# Patient Record
Sex: Male | Born: 2005 | Race: White | Hispanic: No | Marital: Single | Smoking: Never smoker
Health system: Southern US, Community
[De-identification: ages and names within clinical notes are randomized; demographics above are authoritative.]

## PROBLEM LIST (undated history)

## (undated) HISTORY — PX: TYMPANOSTOMY TUBE PLACEMENT: SHX32

---
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
## 2024-03-05 NOTE — Progress Notes (Signed)
 Employment screening

## 2024-04-13 ENCOUNTER — Emergency Department (HOSPITAL_BASED_OUTPATIENT_CLINIC_OR_DEPARTMENT_OTHER)

## 2024-04-13 ENCOUNTER — Emergency Department (HOSPITAL_BASED_OUTPATIENT_CLINIC_OR_DEPARTMENT_OTHER): Admitting: Radiology

## 2024-04-13 ENCOUNTER — Other Ambulatory Visit: Payer: Self-pay

## 2024-04-13 ENCOUNTER — Encounter (HOSPITAL_BASED_OUTPATIENT_CLINIC_OR_DEPARTMENT_OTHER): Payer: Self-pay | Admitting: Emergency Medicine

## 2024-04-13 ENCOUNTER — Emergency Department (HOSPITAL_BASED_OUTPATIENT_CLINIC_OR_DEPARTMENT_OTHER)
Admission: EM | Admit: 2024-04-13 | Discharge: 2024-04-13 | Disposition: A | Discharge status: Home / Self Care | Attending: Emergency Medicine | Admitting: Emergency Medicine

## 2024-04-13 DIAGNOSIS — R1084 Generalized abdominal pain: Secondary | ICD-10-CM | POA: Insufficient documentation

## 2024-04-13 DIAGNOSIS — J020 Streptococcal pharyngitis: Secondary | ICD-10-CM

## 2024-04-13 DIAGNOSIS — Y92410 Unspecified street and highway as the place of occurrence of the external cause: Secondary | ICD-10-CM | POA: Diagnosis not present

## 2024-04-13 DIAGNOSIS — M545 Low back pain, unspecified: Secondary | ICD-10-CM | POA: Diagnosis present

## 2024-04-13 DIAGNOSIS — S22080A Wedge compression fracture of T11-T12 vertebra, initial encounter for closed fracture: Secondary | ICD-10-CM

## 2024-04-13 DIAGNOSIS — S40811A Abrasion of right upper arm, initial encounter: Secondary | ICD-10-CM

## 2024-04-13 LAB — CBC WITH DIFFERENTIAL/PLATELET
Abs Immature Granulocytes: 0.11 K/uL — ABNORMAL HIGH (ref 0.00–0.07)
Basophils Absolute: 0 K/uL (ref 0.0–0.1)
Basophils Relative: 0 %
Eosinophils Absolute: 0.1 K/uL (ref 0.0–0.5)
Eosinophils Relative: 1 %
HCT: 47.4 % (ref 39.0–52.0)
Hemoglobin: 16.5 g/dL (ref 13.0–17.0)
Immature Granulocytes: 1 %
Lymphocytes Relative: 7 %
Lymphs Abs: 0.9 K/uL (ref 0.7–4.0)
MCH: 29.2 pg (ref 26.0–34.0)
MCHC: 34.8 g/dL (ref 30.0–36.0)
MCV: 83.9 fL (ref 80.0–100.0)
Monocytes Absolute: 1 K/uL (ref 0.1–1.0)
Monocytes Relative: 9 %
Neutro Abs: 9.5 K/uL — ABNORMAL HIGH (ref 1.7–7.7)
Neutrophils Relative %: 82 %
Platelets: 171 K/uL (ref 150–400)
RBC: 5.65 MIL/uL (ref 4.22–5.81)
RDW: 12.9 % (ref 11.5–15.5)
WBC: 11.6 K/uL — ABNORMAL HIGH (ref 4.0–10.5)
nRBC: 0 % (ref 0.0–0.2)

## 2024-04-13 LAB — COMPREHENSIVE METABOLIC PANEL WITH GFR
ALT: 35 U/L (ref 0–44)
AST: 35 U/L (ref 15–41)
Albumin: 4.6 g/dL (ref 3.5–5.0)
Alkaline Phosphatase: 111 U/L (ref 38–126)
Anion gap: 13 (ref 5–15)
BUN: 15 mg/dL (ref 6–20)
CO2: 23 mmol/L (ref 22–32)
Calcium: 9.9 mg/dL (ref 8.9–10.3)
Chloride: 104 mmol/L (ref 98–111)
Creatinine, Ser: 1.17 mg/dL (ref 0.61–1.24)
GFR, Estimated: >60 mL/min (ref >60)
Glucose, Bld: 93 mg/dL (ref 70–99)
Potassium: 3.8 mmol/L (ref 3.5–5.1)
Sodium: 140 mmol/L (ref 135–145)
Total Bilirubin: 1.2 mg/dL (ref 0.0–1.2)
Total Protein: 7.6 g/dL (ref 6.5–8.1)

## 2024-04-13 LAB — GROUP A STREP BY PCR: Group A Strep by PCR: DETECTED — AB

## 2024-04-13 MED ORDER — METHOCARBAMOL 500 MG PO TABS: 500.0000 mg | ORAL_TABLET | Freq: Four times a day (QID) | ORAL | 0 refills | Status: AC | PRN

## 2024-04-13 MED ORDER — IBUPROFEN 800 MG PO TABS: 800.0000 mg | ORAL_TABLET | Freq: Three times a day (TID) | ORAL | 0 refills | Status: AC | PRN

## 2024-04-13 MED ORDER — AMOXICILLIN 500 MG PO CAPS
500.0000 mg | ORAL_CAPSULE | Freq: Once | ORAL | Status: AC
  Administered 2024-04-13: 500 mg via ORAL
  Filled 2024-04-13: qty 1

## 2024-04-13 MED ORDER — SENNOSIDES-DOCUSATE SODIUM 8.6-50 MG PO TABS: 1.0000 | ORAL_TABLET | Freq: Every evening | ORAL | 0 refills | Status: AC | PRN

## 2024-04-13 MED ORDER — ONDANSETRON HCL 4 MG/2ML IJ SOLN
4.0000 mg | Freq: Once | INTRAMUSCULAR | Status: AC
  Administered 2024-04-13: 4 mg via INTRAVENOUS
  Filled 2024-04-13: qty 2

## 2024-04-13 MED ORDER — OXYCODONE-ACETAMINOPHEN 5-325 MG PO TABS
1.0000 | ORAL_TABLET | Freq: Four times a day (QID) | ORAL | 0 refills | Status: AC | PRN
Start: 2024-04-13 — End: ?

## 2024-04-13 MED ORDER — AMOXICILLIN 500 MG PO CAPS: 500.0000 mg | ORAL_CAPSULE | Freq: Two times a day (BID) | ORAL | 0 refills | Status: AC

## 2024-04-13 MED ORDER — IOHEXOL 300 MG/ML  SOLN
100.0000 mL | Freq: Once | INTRAMUSCULAR | Status: AC | PRN
  Administered 2024-04-13: 100 mL via INTRAVENOUS

## 2024-04-13 MED ORDER — MORPHINE SULFATE (PF) 4 MG/ML IV SOLN
4.0000 mg | Freq: Once | INTRAVENOUS | Status: AC
  Administered 2024-04-13: 4 mg via INTRAVENOUS
  Filled 2024-04-13: qty 1

## 2024-04-13 NOTE — ED Notes (Signed)
 Patient transported to CT

## 2024-04-13 NOTE — ED Notes (Addendum)
 Cleaned and dressed abrasion on right forearm per pt's request with normal saline, bactrim ointment and non-stick pad. Pt tolerated well.

## 2024-04-13 NOTE — Progress Notes (Signed)
 Orthopedic Tech Progress Note Patient Details:  Steven Hardy 09-20-2006 [id number] Called in TLSO brace  Patient ID: [name], male   DOB: 08-Nov-2005, 18 y.o.   MRN: [id number]  Efrain DELENA Cos 04/13/2024, 4:35 PM
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
## 2024-04-13 NOTE — ED Notes (Signed)
Reviewed discharge instructions, medications, and home care with pt. Pt verbalized understanding and had no further questions. Pt exited ED without complications.

## 2024-04-13 NOTE — ED Notes (Signed)
 Ortho tech contacted for back brace delivery
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
## 2024-04-13 NOTE — ED Notes (Signed)
 TLSO Brace applied by Ortho Tech.

## 2024-04-13 NOTE — ED Notes (Signed)
 X-Ray at bedside.
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
## 2024-08-30 ENCOUNTER — Ambulatory Visit
Admission: EM | Admit: 2024-08-30 | Discharge: 2024-08-30 | Disposition: A | Discharge status: Home / Self Care | Payer: No Typology Code available for payment source | Attending: Nurse Practitioner | Admitting: Nurse Practitioner

## 2024-08-30 DIAGNOSIS — J020 Streptococcal pharyngitis: Secondary | ICD-10-CM | POA: Diagnosis not present

## 2024-08-30 LAB — POCT RAPID STREP A (OFFICE): Rapid Strep A Screen: POSITIVE — AB

## 2024-08-30 MED ORDER — LIDOCAINE VISCOUS HCL 2 % MT SOLN: OROMUCOSAL | 0 refills | Status: AC

## 2024-08-30 MED ORDER — AMOXICILLIN 500 MG PO CAPS: 500.0000 mg | ORAL_CAPSULE | Freq: Two times a day (BID) | ORAL | 0 refills | Status: AC

## 2024-08-30 NOTE — ED Triage Notes (Signed)
 Pt reports sore throat, coughing up blood and white stuff from the back of his throat started yesterday.

## 2024-08-30 NOTE — ED Provider Notes (Signed)
 [facility]    CSN: [id number] Arrival date & time: 08/30/24  0848      History   Chief Complaint No chief complaint on file.   HPI Kable Haywood is a 18 y.o. male.   The history is provided by the patient.   Patient presents for complaints of sore throat, coughing up blood, and swollen lymph nodes open present for 1 day.  He also endorses nasal congestion, runny nose, and cough.  He reports that he has been experiencing episodes of feeling hot and cold.  He denies any obvious close sick contacts.  Further denies headache, ear pain, abdominal pain, nausea, vomiting, diarrhea, or rash.  So far, states he has not taken any medications for his symptoms.  History reviewed. No pertinent past medical history.  There are no active problems to display for this patient.   Past Surgical History:  Procedure Laterality Date   TYMPANOSTOMY TUBE PLACEMENT         Home Medications    Prior to Admission medications  Medication Sig Start Date End Date Taking? Authorizing Provider  ibuprofen  (ADVIL ) 800 MG tablet Take 1 tablet (800 mg total) by mouth every 8 (eight) hours as needed for mild pain (pain score 1-3) or moderate pain (pain score 4-6). 04/13/24   Long, Joshua G, MD  methocarbamol  (ROBAXIN ) 500 MG tablet Take 1 tablet (500 mg total) by mouth every 6 (six) hours as needed for muscle spasms. 04/13/24   Long, Fonda MATSU, MD  oxyCODONE -acetaminophen  (PERCOCET/ROXICET) 5-325 MG tablet Take 1 tablet by mouth every 6 (six) hours as needed for severe pain (pain score 7-10). 04/13/24   Long, Joshua G, MD  senna-docusate (SENOKOT-S) 8.6-50 MG tablet Take 1 tablet by mouth at bedtime as needed for mild constipation or moderate constipation. 04/13/24   Long, Fonda MATSU, MD    Family History History reviewed. No pertinent family history.  Social History Social History[1]   Allergies   Patient has no known allergies.   Review of Systems Review of Systems Per  HPI  Physical Exam Triage Vital Signs ED Triage Vitals  Encounter Vitals Group     BP 08/30/24 0905 (!) 140/86     Girls Systolic BP Percentile --      Girls Diastolic BP Percentile --      Boys Systolic BP Percentile --      Boys Diastolic BP Percentile --      Pulse Rate 08/30/24 0905 96     Resp 08/30/24 0905 18     Temp 08/30/24 0905 98.2 F (36.8 C)     Temp Source 08/30/24 0905 Oral     SpO2 08/30/24 0905 96 %     Weight --      Height --      Head Circumference --      Peak Flow --      Pain Score 08/30/24 0906 7     Pain Loc --      Pain Education --      Exclude from Growth Chart --    No data found.  Updated Vital Signs BP (!) 140/86 (BP Location: Right Arm)   Pulse 96   Temp 98.2 F (36.8 C) (Oral)   Resp 18   SpO2 96%   Visual Acuity Right Eye Distance:   Left Eye Distance:   Bilateral Distance:    Right Eye Near:   Left Eye Near:    Bilateral Near:     Physical Exam Vitals  and nursing note reviewed.  Constitutional:      General: He is not in acute distress.    Appearance: Normal appearance.  HENT:     Head: Normocephalic.     Right Ear: Tympanic membrane, ear canal and external ear normal.     Left Ear: Tympanic membrane, ear canal and external ear normal.     Nose: Congestion present.     Right Turbinates: Enlarged and swollen.     Left Turbinates: Enlarged and swollen.     Right Sinus: No maxillary sinus tenderness or frontal sinus tenderness.     Left Sinus: No maxillary sinus tenderness or frontal sinus tenderness.     Mouth/Throat:     Lips: Pink.     Mouth: Mucous membranes are moist.     Pharynx: Pharyngeal swelling, oropharyngeal exudate, posterior oropharyngeal erythema, uvula swelling and postnasal drip present.     Tonsils: 2+ on the right. 2+ on the left.  Eyes:     Extraocular Movements: Extraocular movements intact.     Conjunctiva/sclera: Conjunctivae normal.     Pupils: Pupils are equal, round, and reactive to light.   Cardiovascular:     Rate and Rhythm: Normal rate and regular rhythm.     Pulses: Normal pulses.     Heart sounds: Normal heart sounds.  Pulmonary:     Effort: Pulmonary effort is normal. No respiratory distress.     Breath sounds: Normal breath sounds. No stridor. No wheezing, rhonchi or rales.  Abdominal:     General: Bowel sounds are normal.     Palpations: Abdomen is soft.  Musculoskeletal:     Cervical back: Normal range of motion.  Lymphadenopathy:     Cervical: Cervical adenopathy present.  Skin:    General: Skin is warm and dry.  Neurological:     General: No focal deficit present.     Mental Status: He is alert and oriented to person, place, and time.  Psychiatric:        Mood and Affect: Mood normal.        Behavior: Behavior normal.      UC Treatments / Results  Labs (all labs ordered are listed, but only abnormal results are displayed) Labs Reviewed  POCT RAPID STREP A (OFFICE) - Abnormal; Notable for the following components:      Result Value   Rapid Strep A Screen Positive (*)    All other components within normal limits    EKG   Radiology No results found.  Procedures Procedures (including critical care time)  Medications Ordered in UC Medications - No data to display  Initial Impression / Assessment and Plan / UC Course  I have reviewed the triage vital signs and the nursing notes.  Pertinent labs & imaging results that were available during my care of the patient were reviewed by me and considered in my medical decision making (see chart for details).  The rapid strep test was positive.  Will treat with amoxicillin  500 mg.  Viscous lidocaine 2% was also provided for throat pain.  Supportive care recommendations were provided and discussed with the patient to include over-the-counter analgesics, warm salt water gargles, use of Chloraseptic throat spray, and discarding his toothbrush after 3 days.  Discussed indications with the patient regarding  follow-up.  Patient was in agreement with this plan of care and verbalizes understanding.  All questions were answered.  Patient stable for discharge.  Final Clinical Impressions(s) / UC Diagnoses   Final diagnoses:  None  Discharge Instructions   None    ED Prescriptions   None    PDMP not reviewed this encounter.    [1]  Social History Tobacco Use   Smoking status: Never   Smokeless tobacco: Never     Gilmer Etta PARAS, NP 08/30/24 [phone]

## 2024-08-30 NOTE — Discharge Instructions (Signed)
 The rapid strep test was positive. Take medication as prescribed. Increase fluids and allow for plenty of rest. You may take over-the-counter Tylenol  or ibuprofen  as needed for pain, fever, or general discomfort. Recommend warm salt water gargles 3-4 times daily while symptoms persist or you may use over-the-counter Chloraseptic throat spray or throat lozenges as needed. Recommend a soft diet until symptoms improve. Discard your toothbrush after 3 days. If your symptoms fail to improve, or begin to worsen, you may follow-up in this clinic or with your primary care physician for further evaluation. Follow-up as needed.
